# Patient Record
Sex: Male | Born: 1985 | Race: White | Hispanic: No | Marital: Married | Smoking: Former smoker
Health system: Southern US, Community
[De-identification: ages and names within clinical notes are randomized; demographics above are authoritative.]

## PROBLEM LIST (undated history)

## (undated) DIAGNOSIS — R569 Unspecified convulsions: Secondary | ICD-10-CM

## (undated) HISTORY — PX: KNEE SURGERY: SHX244

---

## 2009-01-14 ENCOUNTER — Emergency Department (HOSPITAL_COMMUNITY): Admission: EM | Admit: 2009-01-14 | Discharge: 2009-01-14 | Payer: Self-pay | Admitting: Emergency Medicine

## 2012-03-31 ENCOUNTER — Emergency Department (HOSPITAL_COMMUNITY): Payer: Self-pay

## 2012-03-31 ENCOUNTER — Encounter (HOSPITAL_COMMUNITY): Payer: Self-pay | Admitting: Emergency Medicine

## 2012-03-31 ENCOUNTER — Emergency Department (HOSPITAL_COMMUNITY)
Admission: EM | Admit: 2012-03-31 | Discharge: 2012-03-31 | Disposition: A | Discharge status: Home / Self Care | Payer: Self-pay | Attending: Emergency Medicine | Admitting: Emergency Medicine

## 2012-03-31 DIAGNOSIS — G40909 Epilepsy, unspecified, not intractable, without status epilepticus: Secondary | ICD-10-CM | POA: Insufficient documentation

## 2012-03-31 DIAGNOSIS — R569 Unspecified convulsions: Secondary | ICD-10-CM

## 2012-03-31 DIAGNOSIS — Z79899 Other long term (current) drug therapy: Secondary | ICD-10-CM | POA: Insufficient documentation

## 2012-03-31 HISTORY — DX: Unspecified convulsions: R56.9

## 2012-03-31 LAB — CBC WITH DIFFERENTIAL/PLATELET
Basophils Absolute: 0 10*3/uL (ref 0.0–0.1)
Eosinophils Relative: 1 % (ref 0–5)
HCT: 41.2 % (ref 39.0–52.0)
Lymphocytes Relative: 8 % — ABNORMAL LOW (ref 12–46)
Lymphs Abs: 1.7 10*3/uL (ref 0.7–4.0)
MCV: 84.9 fL (ref 78.0–100.0)
Monocytes Absolute: 1.2 10*3/uL — ABNORMAL HIGH (ref 0.1–1.0)
Monocytes Relative: 6 % (ref 3–12)
RDW: 12.4 % (ref 11.5–15.5)
WBC: 19.7 10*3/uL — ABNORMAL HIGH (ref 4.0–10.5)

## 2012-03-31 LAB — POCT I-STAT, CHEM 8
BUN: 13 mg/dL (ref 6–23)
Chloride: 105 mEq/L (ref 96–112)
Creatinine, Ser: 1 mg/dL (ref 0.50–1.35)
Potassium: 3.5 mEq/L (ref 3.5–5.1)
Sodium: 141 mEq/L (ref 135–145)

## 2012-03-31 LAB — RAPID URINE DRUG SCREEN, HOSP PERFORMED
Amphetamines: NOT DETECTED
Cocaine: NOT DETECTED
Opiates: NOT DETECTED

## 2012-03-31 LAB — URINALYSIS, ROUTINE W REFLEX MICROSCOPIC
Bilirubin Urine: NEGATIVE
Glucose, UA: NEGATIVE mg/dL
Hgb urine dipstick: NEGATIVE
Ketones, ur: NEGATIVE mg/dL
pH: 5.5 (ref 5.0–8.0)

## 2012-03-31 MED ORDER — CARBAMAZEPINE ER 400 MG PO TB12: 400.0000 mg | ORAL_TABLET | Freq: Two times a day (BID) | ORAL | Status: AC

## 2012-03-31 MED ORDER — SODIUM CHLORIDE 0.9 % IV SOLN
1000.0000 mg | Freq: Once | INTRAVENOUS | Status: AC
  Administered 2012-03-31: 1000 mg via INTRAVENOUS
  Filled 2012-03-31: qty 10

## 2012-03-31 NOTE — ED Notes (Signed)
ION:GEXB<MW> Expected date:<BR> Expected time:<BR> Means of arrival:<BR> Comments:<BR> EMS/seizure-has not been taking meds

## 2012-03-31 NOTE — ED Notes (Signed)
Report given via EMS. Pt c/o unwitnessed seizure. Pt went to bed, woke up disoriented at 2345, and felt like he had a seizure, informed his roommate. Pt hx of seizure and has not been compliant with medication for 4-5 days. Last seizure a year ago. No oral trauma, no incontinence. No witnessed seizure on transport. Initial VS 100/64 Pulse 80 RR 16 CBG 92 at 0011.

## 2012-03-31 NOTE — ED Notes (Signed)
Pt. On monitor. 

## 2012-03-31 NOTE — ED Notes (Signed)
Pt states he has not been compliant with his medications, because his roommates take his pills and has not been able to get a refill.

## 2012-03-31 NOTE — ED Notes (Signed)
EKG given to EDP, Palumbo,MD. 

## 2012-03-31 NOTE — ED Provider Notes (Addendum)
History     CSN: [id number]  Arrival date & time 03/31/12  4098   First MD Initiated Contact with Patient 03/31/12 0037      Chief Complaint  Patient presents with  . Seizures    (Consider location/radiation/quality/duration/timing/severity/associated sxs/prior treatment) Patient is a 26 y.o. male presenting with seizures. The history is provided by the patient and the EMS personnel.  Seizures  This is a recurrent problem. The current episode started 1 to 2 hours ago. The problem has been resolved. There was 1 seizure. Duration: unknown. Associated symptoms include sleepiness. Pertinent negatives include patient does not experience confusion, no speech difficulty, no visual disturbance, no neck stiffness, no chest pain, no cough, no nausea, no vomiting, no diarrhea and no muscle weakness. Characteristics do not include eye blinking. The episode was not witnessed. There was no sensation of an aura present. The seizures did not continue in the ED. The seizure(s) had no focality. Possible causes include missed seizure meds. There has been no fever. There were no medications administered prior to arrival.    Past Medical History  Diagnosis Date  . Seizures     History reviewed. No pertinent past surgical history.  No family history on file.  History  Substance Use Topics  . Smoking status: Not on file  . Smokeless tobacco: Not on file  . Alcohol Use:       Review of Systems  Eyes: Negative for visual disturbance.  Respiratory: Negative for cough.   Cardiovascular: Negative for chest pain.  Gastrointestinal: Negative for nausea, vomiting and diarrhea.  Neurological: Positive for seizures. Negative for speech difficulty.  Psychiatric/Behavioral: Negative for confusion.  All other systems reviewed and are negative.    Allergies  Review of patient's allergies indicates no known allergies.  Home Medications   Current Outpatient Rx  Name Route Sig Dispense Refill  .  CARBAMAZEPINE ER 400 MG PO TB12 Oral Take 400 mg by mouth 2 (two) times daily.    Marland Kitchen CARBAMAZEPINE ER 400 MG PO TB12 Oral Take 1 tablet (400 mg total) by mouth 2 (two) times daily. 30 tablet 0    BP 107/52  Pulse 62  Temp 98.3 F (36.8 C) (Oral)  Resp 20  SpO2 100%  Physical Exam  Constitutional: He is oriented to person, place, and time. He appears well-developed and well-nourished.  HENT:  Head: Normocephalic and atraumatic. Head is without raccoon's eyes and without Battle's sign.  Right Ear: No mastoid tenderness. No hemotympanum.  Left Ear: No mastoid tenderness. No hemotympanum.  Mouth/Throat: Oropharynx is clear and moist.  Eyes: Conjunctivae and EOM are normal. Pupils are equal, round, and reactive to light.  Neck: Normal range of motion. Neck supple.  Cardiovascular: Normal rate and regular rhythm.   Pulmonary/Chest: Effort normal and breath sounds normal.  Abdominal: Soft. Bowel sounds are normal. There is no tenderness. There is no rebound and no guarding.  Musculoskeletal: Normal range of motion.  Neurological: He is alert and oriented to person, place, and time. He has normal reflexes. No cranial nerve deficit.  Skin: Skin is warm and dry.  Psychiatric: He has a normal mood and affect.    ED Course  Procedures (including critical care time)  Labs Reviewed  CBC WITH DIFFERENTIAL - Abnormal; Notable for the following:    WBC 19.7 (*)     Neutrophils Relative 85 (*)     Neutro Abs 16.6 (*)     Lymphocytes Relative 8 (*)     Monocytes Absolute  1.2 (*)     All other components within normal limits  URINALYSIS, ROUTINE W REFLEX MICROSCOPIC - Abnormal; Notable for the following:    APPearance CLOUDY (*)     Specific Gravity, Urine 1.031 (*)     All other components within normal limits  CARBAMAZEPINE LEVEL, TOTAL - Abnormal; Notable for the following:    Carbamazepine Lvl <0.5 (*)     All other components within normal limits  GLUCOSE, CAPILLARY - Abnormal; Notable  for the following:    Glucose-Capillary 104 (*)     All other components within normal limits  POCT I-STAT, CHEM 8 - Abnormal; Notable for the following:    Glucose, Bld 105 (*)     All other components within normal limits  URINE RAPID DRUG SCREEN (HOSP PERFORMED)  URINALYSIS, ROUTINE W REFLEX MICROSCOPIC   Dg Chest 2 View  03/31/2012  *RADIOLOGY REPORT*  Clinical Data: Seizures  CHEST - 2 VIEW  Comparison: None  Findings: Upper normal heart size. Mediastinal contours and pulmonary vascularity normal. Minimal peribronchial thickening. No infiltrate, pleural effusion or pneumothorax. No acute osseous findings.  IMPRESSION: Minimal bronchitic changes.   Original Report Authenticated By: Lollie Marrow, M.D.      1. Seizure       MDM  No driving for six months or until cleared by your neurologist.  Follow up with your neurologist     Date: 03/31/2012  Rate: 68  Rhythm: normal sinus rhythm  QRS Axis: normal  Intervals: normal  ST/T Wave abnormalities: normal  Conduction Disutrbances:none  Narrative Interpretation: rsr'  Old EKG Reviewed: none available      Kent Braunschweig K Bladyn Tipps-Rasch, MD 03/31/12 0446  Sugey Trevathan K Citlali Gautney-Rasch, MD 03/31/12 [phone]

## 2014-02-13 IMAGING — CR DG CHEST 2V
2 series · 2 of 2 positions shown · non-contrast
Comparison: None

CLINICAL DATA: Seizures

CHEST - 2 VIEW

[w chest lat]
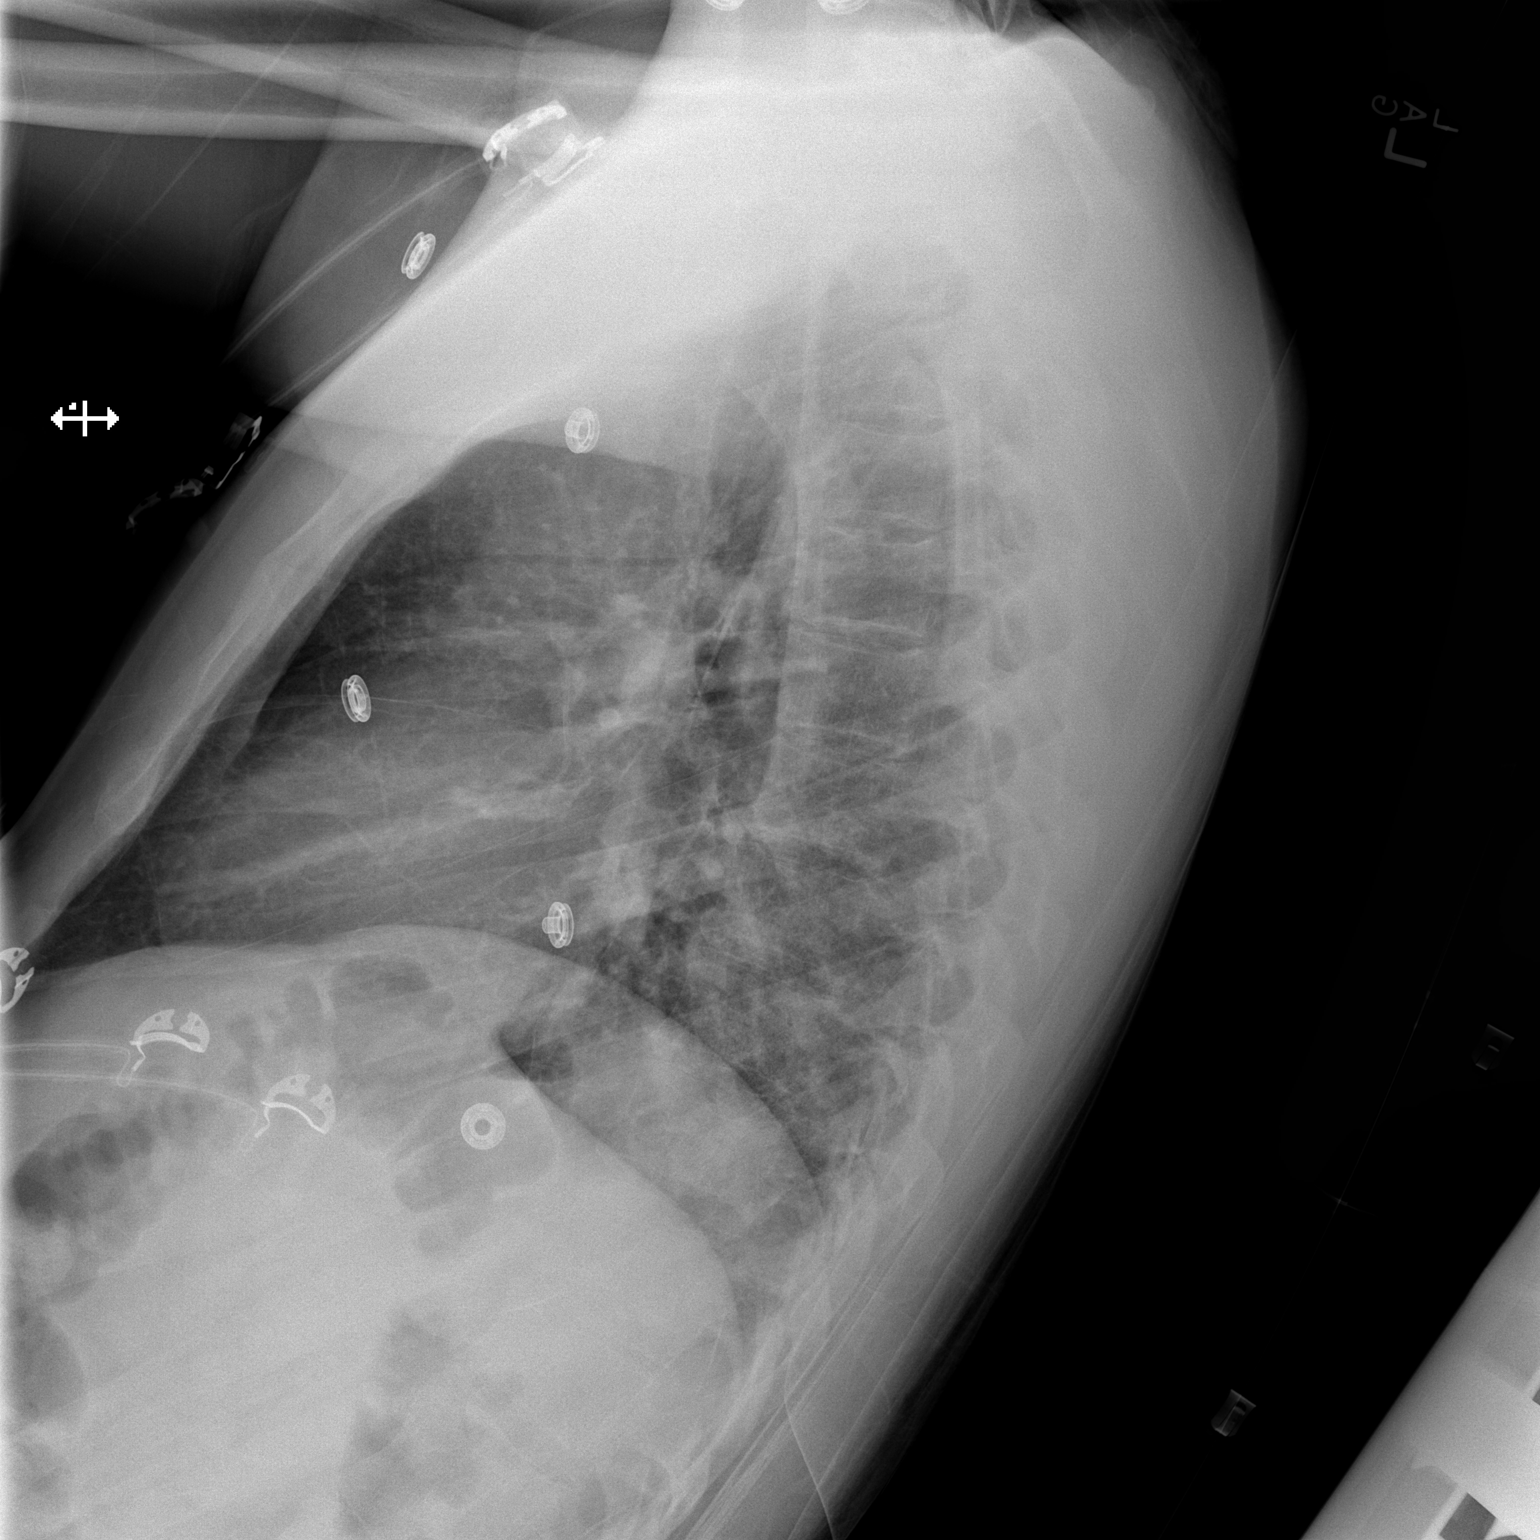

[x chest ap]
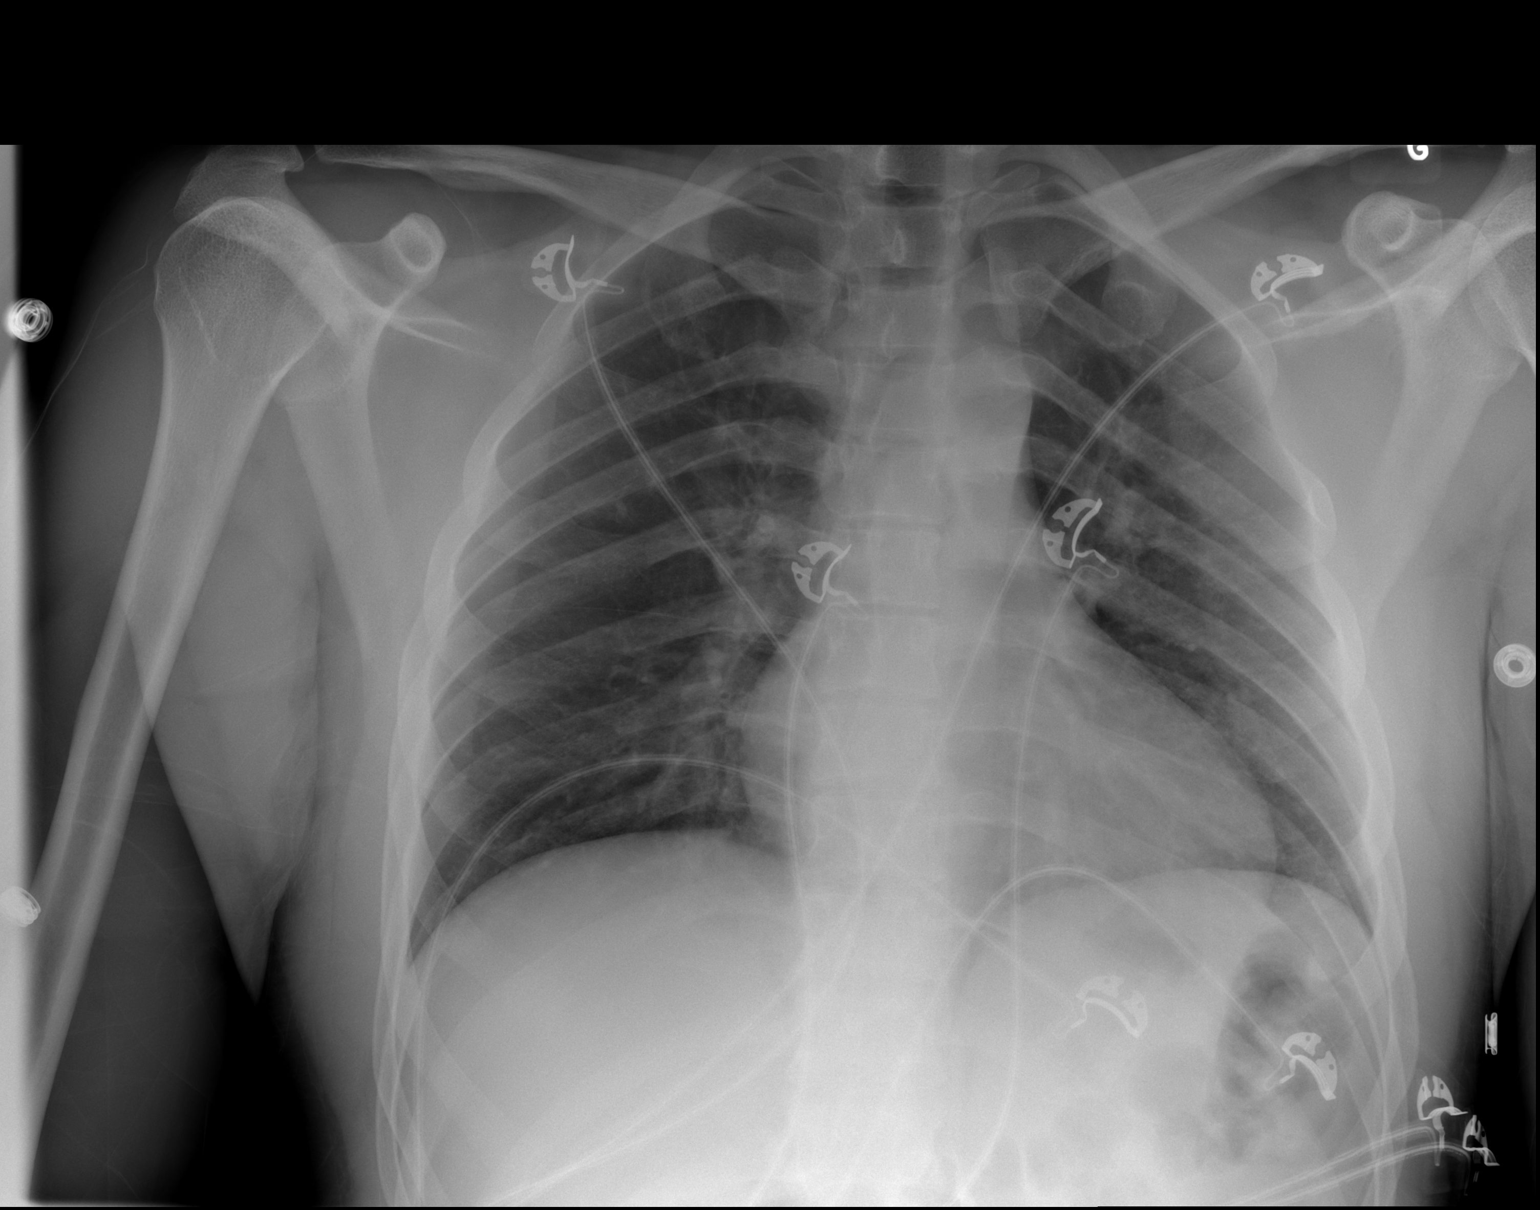

[2 of 2 positions shown; findings below may reference images not displayed]

FINDINGS: Upper normal heart size.
Mediastinal contours and pulmonary vascularity normal.
Minimal peribronchial thickening.
No infiltrate, pleural effusion or pneumothorax.
No acute osseous findings.
IMPRESSION: Minimal bronchitic changes.

## 2021-01-29 ENCOUNTER — Emergency Department (INDEPENDENT_AMBULATORY_CARE_PROVIDER_SITE_OTHER)
Admission: EM | Admit: 2021-01-29 | Discharge: 2021-01-29 | Disposition: A | Discharge status: Home / Self Care | Payer: Medicaid Other | Source: Home / Self Care

## 2021-01-29 DIAGNOSIS — H60332 Swimmer's ear, left ear: Secondary | ICD-10-CM

## 2021-01-29 MED ORDER — NEOMYCIN-POLYMYXIN-HC 3.5-10000-1 OT SUSP: 4.0000 [drp] | Freq: Three times a day (TID) | OTIC | 0 refills | Status: AC

## 2021-01-29 MED ORDER — CIPROFLOXACIN HCL 500 MG PO TABS: 500.0000 mg | ORAL_TABLET | Freq: Two times a day (BID) | ORAL | 0 refills | Status: AC

## 2021-01-29 NOTE — Discharge Instructions (Signed)
Ibuprofen for pain.  You may take 800 mg 3 times a day with meals Use the eardrops several times a day until your ears feel completely normal.  It may be 5 to 7 days Take antibiotic as directed Return as needed

## 2021-01-29 NOTE — ED Triage Notes (Signed)
Pt presents to Urgent Care with c/o L ear pain x 2-3 days. No other s/s.
